# Patient Record
Sex: Female | Born: 1960 | Race: White | Hispanic: No | Marital: Married
Health system: Southern US, Community
[De-identification: ages and names within clinical notes are randomized; demographics above are authoritative.]

---

## 2001-11-22 ENCOUNTER — Encounter: Payer: Self-pay | Admitting: Family Medicine

## 2001-11-22 ENCOUNTER — Encounter: Admission: RE | Admit: 2001-11-22 | Discharge: 2001-11-22 | Payer: Self-pay | Admitting: Family Medicine

## 2004-09-27 ENCOUNTER — Other Ambulatory Visit: Admission: RE | Admit: 2004-09-27 | Discharge: 2004-09-27 | Payer: Self-pay | Admitting: Family Medicine

## 2006-09-11 ENCOUNTER — Other Ambulatory Visit: Admission: RE | Admit: 2006-09-11 | Discharge: 2006-09-11 | Payer: Self-pay | Admitting: Family Medicine

## 2014-08-18 ENCOUNTER — Other Ambulatory Visit: Payer: Self-pay

## 2014-08-18 DIAGNOSIS — Z1231 Encounter for screening mammogram for malignant neoplasm of breast: Secondary | ICD-10-CM

## 2014-08-25 ENCOUNTER — Ambulatory Visit
Admission: RE | Admit: 2014-08-25 | Discharge: 2014-08-25 | Disposition: A | Discharge status: Home / Self Care | Payer: BLUE CROSS/BLUE SHIELD | Source: Ambulatory Visit

## 2014-08-25 DIAGNOSIS — Z1231 Encounter for screening mammogram for malignant neoplasm of breast: Secondary | ICD-10-CM

## 2014-09-02 ENCOUNTER — Other Ambulatory Visit: Payer: Self-pay | Admitting: Family Medicine

## 2014-09-02 DIAGNOSIS — R928 Other abnormal and inconclusive findings on diagnostic imaging of breast: Secondary | ICD-10-CM

## 2014-09-05 ENCOUNTER — Ambulatory Visit
Admission: RE | Admit: 2014-09-05 | Discharge: 2014-09-05 | Disposition: A | Discharge status: Home / Self Care | Payer: BLUE CROSS/BLUE SHIELD | Source: Ambulatory Visit | Attending: Family Medicine | Admitting: Family Medicine

## 2014-09-05 DIAGNOSIS — R928 Other abnormal and inconclusive findings on diagnostic imaging of breast: Secondary | ICD-10-CM

## 2015-03-04 ENCOUNTER — Other Ambulatory Visit: Payer: Self-pay | Admitting: Family Medicine

## 2015-03-04 DIAGNOSIS — N6310 Unspecified lump in the right breast, unspecified quadrant: Secondary | ICD-10-CM

## 2015-03-13 ENCOUNTER — Ambulatory Visit
Admission: RE | Admit: 2015-03-13 | Discharge: 2015-03-13 | Disposition: A | Discharge status: Home / Self Care | Payer: BLUE CROSS/BLUE SHIELD | Source: Ambulatory Visit | Attending: Family Medicine | Admitting: Family Medicine

## 2015-03-13 DIAGNOSIS — N6310 Unspecified lump in the right breast, unspecified quadrant: Secondary | ICD-10-CM

## 2016-01-25 ENCOUNTER — Other Ambulatory Visit: Payer: Self-pay | Admitting: Family Medicine

## 2016-01-25 DIAGNOSIS — N63 Unspecified lump in unspecified breast: Secondary | ICD-10-CM

## 2016-02-02 ENCOUNTER — Ambulatory Visit
Admission: RE | Admit: 2016-02-02 | Discharge: 2016-02-02 | Disposition: A | Discharge status: Home / Self Care | Payer: BLUE CROSS/BLUE SHIELD | Source: Ambulatory Visit | Attending: Family Medicine | Admitting: Family Medicine

## 2016-02-02 DIAGNOSIS — N63 Unspecified lump in unspecified breast: Secondary | ICD-10-CM

## 2017-02-13 ENCOUNTER — Other Ambulatory Visit: Payer: Self-pay | Admitting: Family Medicine

## 2017-02-13 DIAGNOSIS — Z1231 Encounter for screening mammogram for malignant neoplasm of breast: Secondary | ICD-10-CM

## 2017-03-13 ENCOUNTER — Ambulatory Visit: Payer: BLUE CROSS/BLUE SHIELD

## 2017-03-20 ENCOUNTER — Ambulatory Visit
Admission: RE | Admit: 2017-03-20 | Discharge: 2017-03-20 | Disposition: A | Discharge status: Home / Self Care | Payer: BLUE CROSS/BLUE SHIELD | Source: Ambulatory Visit | Attending: Family Medicine | Admitting: Family Medicine

## 2017-03-20 DIAGNOSIS — Z1231 Encounter for screening mammogram for malignant neoplasm of breast: Secondary | ICD-10-CM

## 2017-05-05 ENCOUNTER — Other Ambulatory Visit: Payer: Self-pay | Admitting: Family Medicine

## 2017-05-05 DIAGNOSIS — Z139 Encounter for screening, unspecified: Secondary | ICD-10-CM

## 2017-06-21 DIAGNOSIS — K5730 Diverticulosis of large intestine without perforation or abscess without bleeding: Secondary | ICD-10-CM | POA: Diagnosis not present

## 2017-06-21 DIAGNOSIS — Z1211 Encounter for screening for malignant neoplasm of colon: Secondary | ICD-10-CM | POA: Diagnosis not present

## 2017-06-21 DIAGNOSIS — K635 Polyp of colon: Secondary | ICD-10-CM | POA: Diagnosis not present

## 2017-06-21 DIAGNOSIS — K648 Other hemorrhoids: Secondary | ICD-10-CM | POA: Diagnosis not present

## 2017-06-27 DIAGNOSIS — K635 Polyp of colon: Secondary | ICD-10-CM | POA: Diagnosis not present

## 2017-06-27 DIAGNOSIS — Z1211 Encounter for screening for malignant neoplasm of colon: Secondary | ICD-10-CM | POA: Diagnosis not present

## 2018-02-14 DIAGNOSIS — Z131 Encounter for screening for diabetes mellitus: Secondary | ICD-10-CM | POA: Diagnosis not present

## 2018-02-14 DIAGNOSIS — Z0000 Encounter for general adult medical examination without abnormal findings: Secondary | ICD-10-CM | POA: Diagnosis not present

## 2018-02-14 DIAGNOSIS — Z13220 Encounter for screening for lipoid disorders: Secondary | ICD-10-CM | POA: Diagnosis not present

## 2018-03-21 ENCOUNTER — Ambulatory Visit
Admission: RE | Admit: 2018-03-21 | Discharge: 2018-03-21 | Disposition: A | Discharge status: Home / Self Care | Payer: BLUE CROSS/BLUE SHIELD | Source: Ambulatory Visit | Attending: Family Medicine | Admitting: Family Medicine

## 2018-03-21 DIAGNOSIS — Z1231 Encounter for screening mammogram for malignant neoplasm of breast: Secondary | ICD-10-CM | POA: Diagnosis not present

## 2018-03-21 DIAGNOSIS — Z139 Encounter for screening, unspecified: Secondary | ICD-10-CM

## 2019-02-19 DIAGNOSIS — Z0000 Encounter for general adult medical examination without abnormal findings: Secondary | ICD-10-CM | POA: Diagnosis not present

## 2019-02-19 DIAGNOSIS — L309 Dermatitis, unspecified: Secondary | ICD-10-CM | POA: Diagnosis not present

## 2019-03-04 ENCOUNTER — Other Ambulatory Visit: Payer: Self-pay | Admitting: Family Medicine

## 2019-03-04 DIAGNOSIS — Z1231 Encounter for screening mammogram for malignant neoplasm of breast: Secondary | ICD-10-CM

## 2019-03-08 DIAGNOSIS — Z13220 Encounter for screening for lipoid disorders: Secondary | ICD-10-CM | POA: Diagnosis not present

## 2019-03-08 DIAGNOSIS — Z131 Encounter for screening for diabetes mellitus: Secondary | ICD-10-CM | POA: Diagnosis not present

## 2019-03-22 DIAGNOSIS — H60503 Unspecified acute noninfective otitis externa, bilateral: Secondary | ICD-10-CM | POA: Diagnosis not present

## 2019-04-17 ENCOUNTER — Ambulatory Visit: Payer: BC Managed Care – PPO | Attending: Internal Medicine

## 2019-04-17 DIAGNOSIS — Z23 Encounter for immunization: Secondary | ICD-10-CM | POA: Diagnosis not present

## 2019-04-17 NOTE — Progress Notes (Signed)
   Covid-19 Vaccination Clinic  Name:  Abigail Wall    MRN: [id number] DOB: [birth year]  04/17/2019  [name] was observed post Covid-19 immunization for 15 minutes without incidence. She was provided with Vaccine Information Sheet and instruction to access the V-Safe system.   [name] was instructed to call 911 with any severe reactions post vaccine: Marland Kitchen Difficulty breathing  . Swelling of your face and throat  . A fast heartbeat  . A bad rash all over your body  . Dizziness and weakness    Immunizations Administered    Name Date Dose VIS Date Route   Pfizer COVID-19 Vaccine 04/17/2019  9:22 AM 0.3 mL 03/15/2019 Intramuscular   Manufacturer: [employer], [employer]   Lot: [id number]   NDC: [address]-9

## 2019-04-18 ENCOUNTER — Ambulatory Visit
Admission: RE | Admit: 2019-04-18 | Discharge: 2019-04-18 | Disposition: A | Discharge status: Home / Self Care | Payer: BC Managed Care – PPO | Source: Ambulatory Visit | Attending: Family Medicine | Admitting: Family Medicine

## 2019-04-18 ENCOUNTER — Other Ambulatory Visit: Payer: Self-pay

## 2019-04-18 DIAGNOSIS — Z1231 Encounter for screening mammogram for malignant neoplasm of breast: Secondary | ICD-10-CM | POA: Diagnosis not present

## 2019-05-08 ENCOUNTER — Ambulatory Visit: Payer: BC Managed Care – PPO | Attending: Internal Medicine

## 2019-05-08 DIAGNOSIS — Z23 Encounter for immunization: Secondary | ICD-10-CM

## 2019-05-08 NOTE — Progress Notes (Signed)
   Covid-19 Vaccination Clinic  Name:  SHERRELL WEIR    MRN: [id number] DOB: 11-21-1960  05/08/2019  Ms. Beale was observed post Covid-19 immunization for 15 minutes without incidence. She was provided with Vaccine Information Sheet and instruction to access the V-Safe system.   Ms. Schlereth was instructed to call 911 with any severe reactions post vaccine: Marland Kitchen Difficulty breathing  . Swelling of your face and throat  . A fast heartbeat  . A bad rash all over your body  . Dizziness and weakness    Immunizations Administered    Name Date Dose VIS Date Route   Pfizer COVID-19 Vaccine 05/08/2019  8:09 AM 0.3 mL 03/15/2019 Intramuscular   Manufacturer: [employer], [employer]   Lot: [id number]   NDC: [address]-9

## 2020-01-09 ENCOUNTER — Other Ambulatory Visit: Payer: Self-pay | Admitting: Family Medicine

## 2020-01-09 DIAGNOSIS — Z1231 Encounter for screening mammogram for malignant neoplasm of breast: Secondary | ICD-10-CM

## 2020-02-17 DIAGNOSIS — R21 Rash and other nonspecific skin eruption: Secondary | ICD-10-CM | POA: Diagnosis not present

## 2020-02-17 DIAGNOSIS — Z20822 Contact with and (suspected) exposure to covid-19: Secondary | ICD-10-CM | POA: Diagnosis not present

## 2020-02-17 DIAGNOSIS — Z13220 Encounter for screening for lipoid disorders: Secondary | ICD-10-CM | POA: Diagnosis not present

## 2020-02-17 DIAGNOSIS — Z0000 Encounter for general adult medical examination without abnormal findings: Secondary | ICD-10-CM | POA: Diagnosis not present

## 2020-04-21 ENCOUNTER — Ambulatory Visit: Payer: BC Managed Care – PPO

## 2020-06-04 IMAGING — MG DIGITAL SCREENING BILAT W/ CAD
4 series · 4 of 4 positions shown · non-contrast
Comparison: Previous exam(s).

CLINICAL DATA: Screening.

EXAM:
DIGITAL SCREENING BILATERAL MAMMOGRAM WITH CAD

[L MLO]
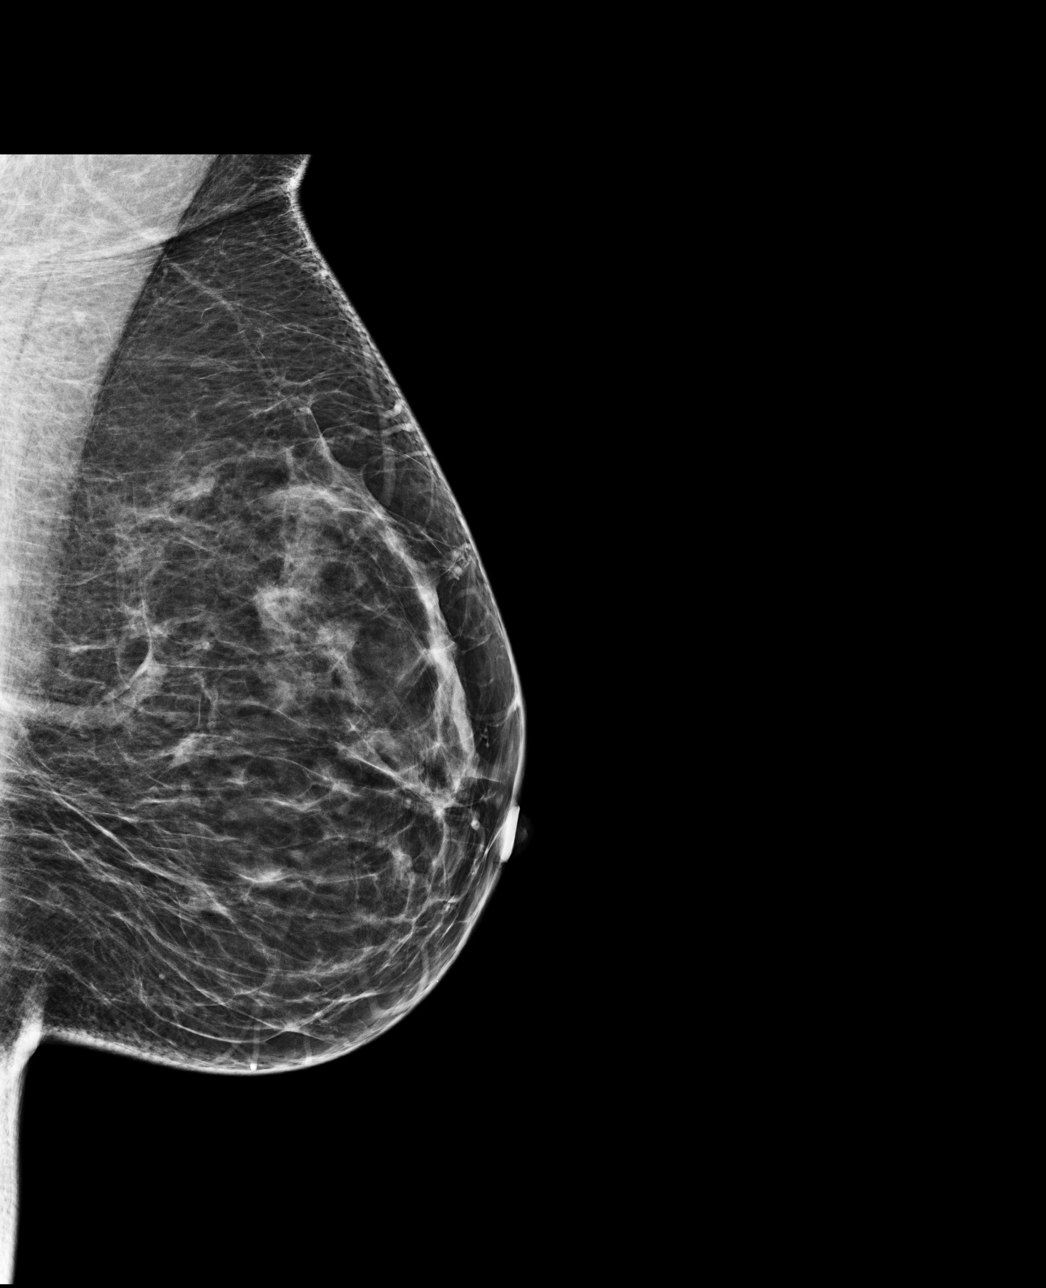

[R CC]
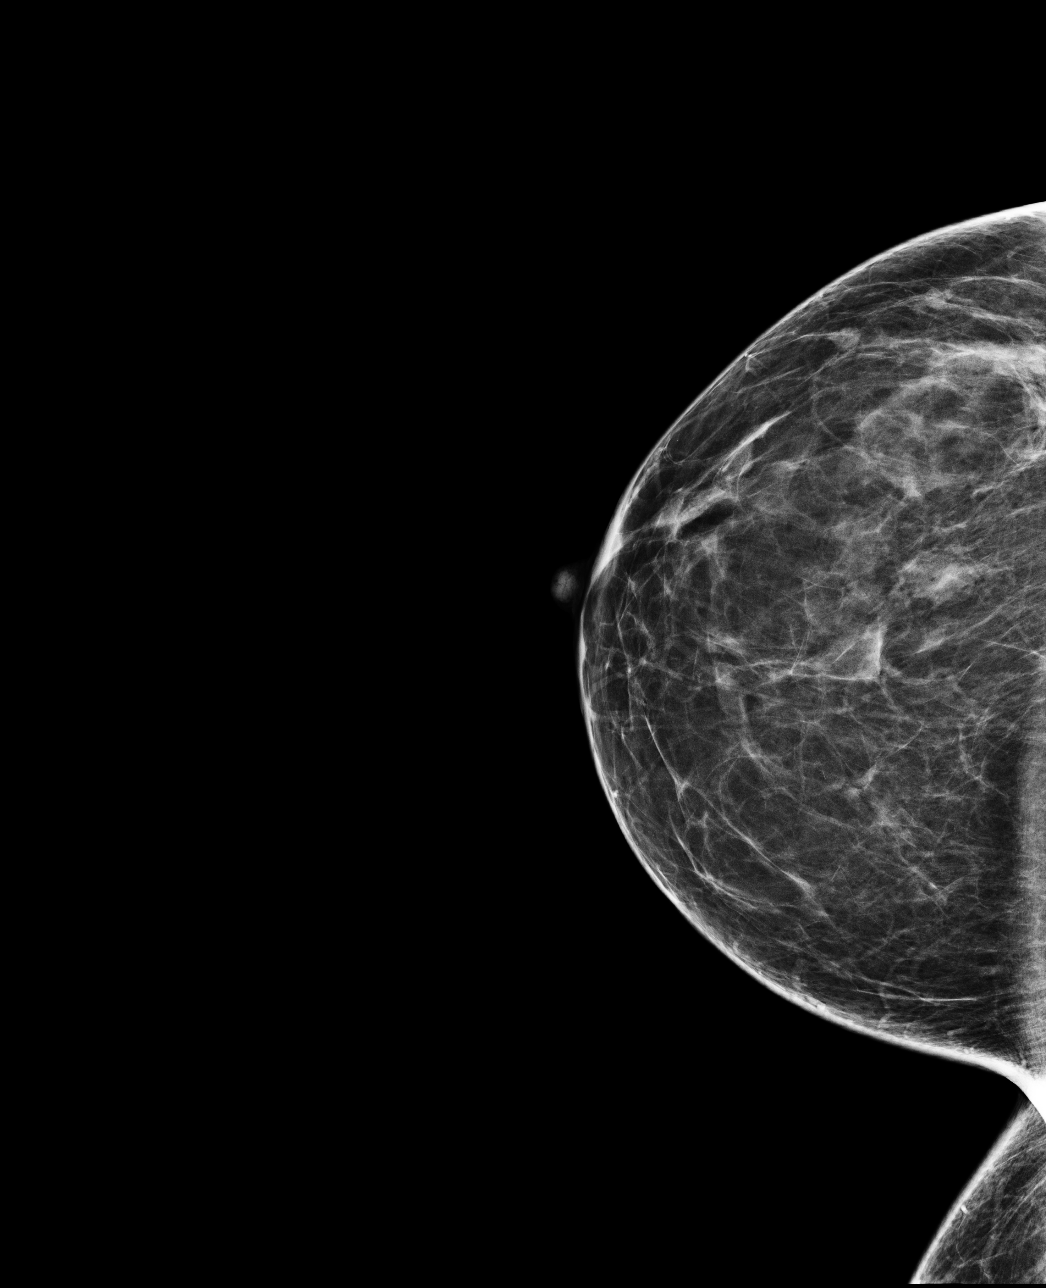

[L CC]
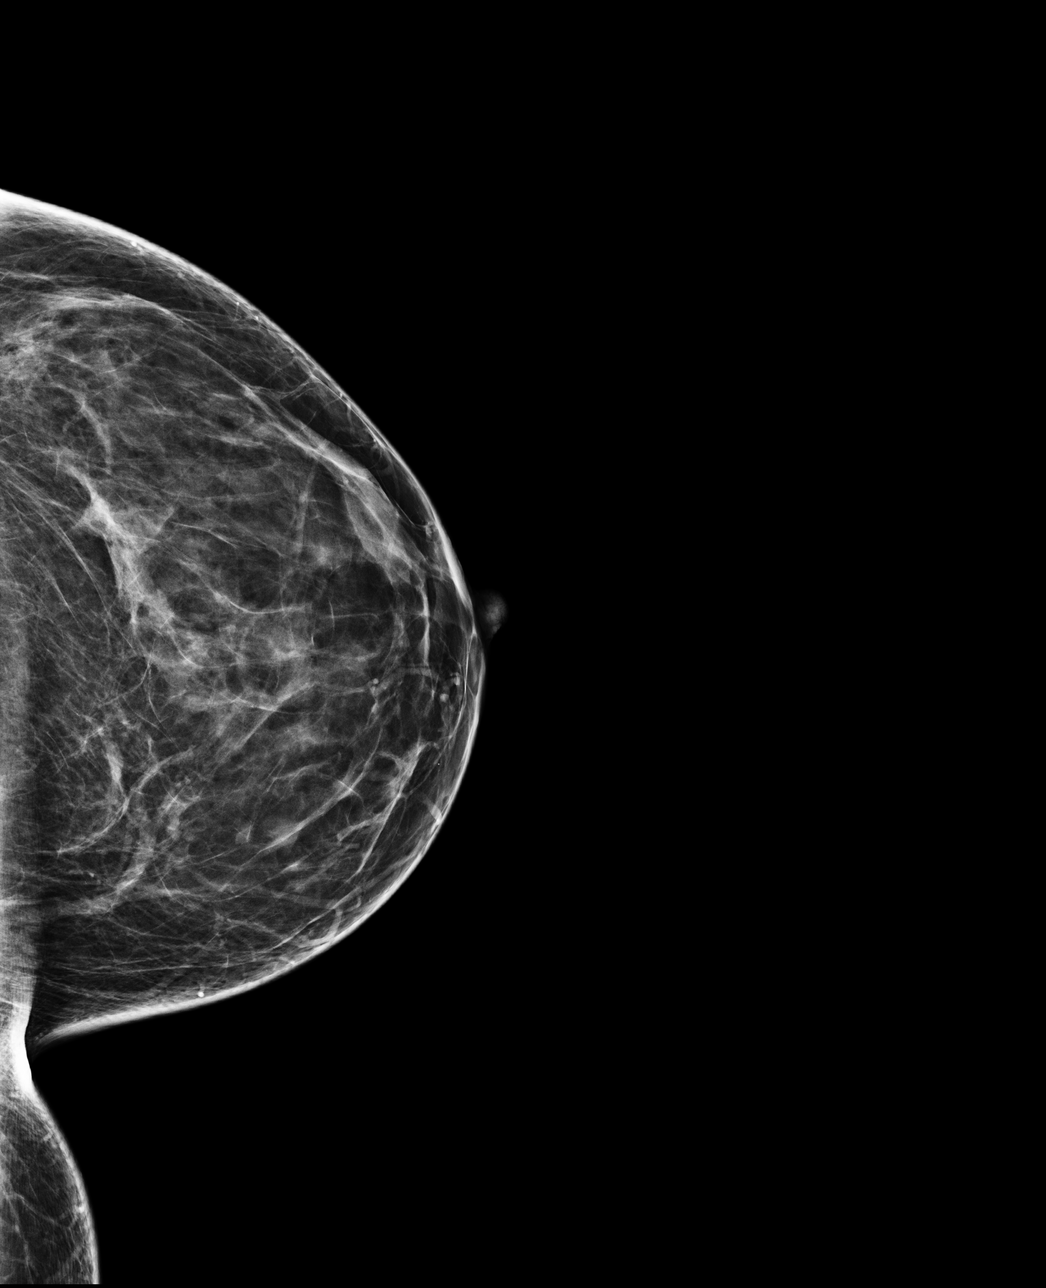

[R MLO]
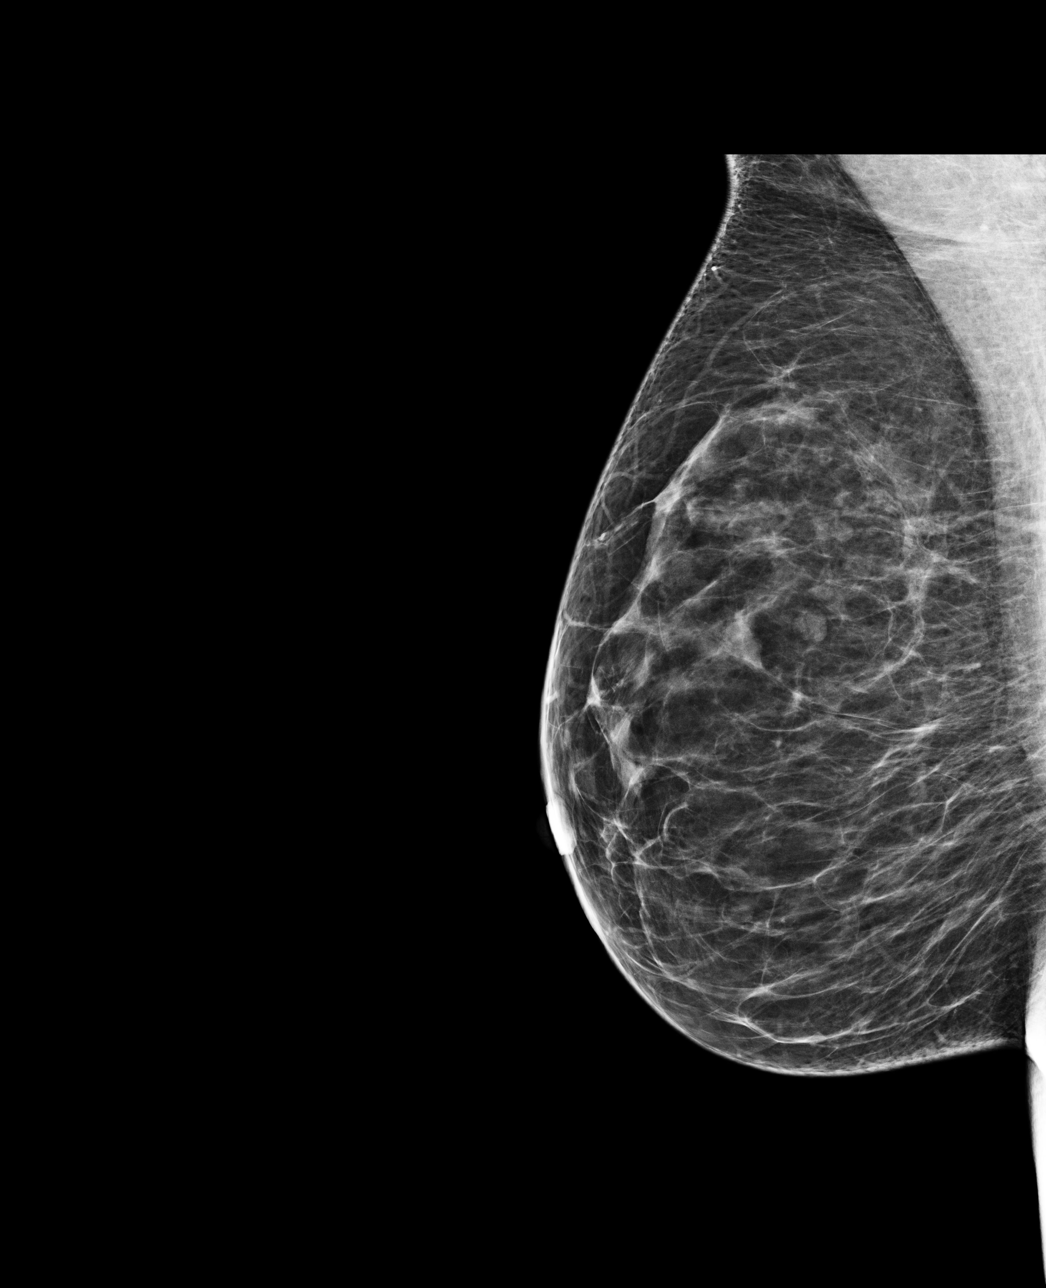

[4 of 4 positions shown; findings below may reference images not displayed]

ACR Breast Density Category c: The breast tissue is heterogeneously
dense, which may obscure small masses.
FINDINGS: There are no findings suspicious for malignancy. Images were
processed with CAD.
IMPRESSION: No mammographic evidence of malignancy. A result letter of this
screening mammogram will be mailed directly to the patient.

RECOMMENDATION:
Screening mammogram in one year. (Code:[id number])

BI-RADS CATEGORY  1: Negative.

## 2020-06-10 ENCOUNTER — Other Ambulatory Visit: Payer: Self-pay

## 2020-06-10 ENCOUNTER — Ambulatory Visit
Admission: RE | Admit: 2020-06-10 | Discharge: 2020-06-10 | Disposition: A | Discharge status: Home / Self Care | Payer: BC Managed Care – PPO | Source: Ambulatory Visit | Attending: Family Medicine | Admitting: Family Medicine

## 2020-06-10 DIAGNOSIS — Z1231 Encounter for screening mammogram for malignant neoplasm of breast: Secondary | ICD-10-CM

## 2020-06-15 ENCOUNTER — Other Ambulatory Visit: Payer: Self-pay | Admitting: Family Medicine

## 2020-06-15 DIAGNOSIS — R928 Other abnormal and inconclusive findings on diagnostic imaging of breast: Secondary | ICD-10-CM

## 2020-06-23 ENCOUNTER — Other Ambulatory Visit: Payer: Self-pay | Admitting: Family Medicine

## 2020-06-23 DIAGNOSIS — R928 Other abnormal and inconclusive findings on diagnostic imaging of breast: Secondary | ICD-10-CM

## 2020-06-26 ENCOUNTER — Ambulatory Visit
Admission: RE | Admit: 2020-06-26 | Discharge: 2020-06-26 | Disposition: A | Discharge status: Home / Self Care | Payer: BC Managed Care – PPO | Source: Ambulatory Visit | Attending: Family Medicine | Admitting: Family Medicine

## 2020-06-26 ENCOUNTER — Other Ambulatory Visit: Payer: Self-pay

## 2020-06-26 DIAGNOSIS — R928 Other abnormal and inconclusive findings on diagnostic imaging of breast: Secondary | ICD-10-CM

## 2021-05-17 ENCOUNTER — Other Ambulatory Visit: Payer: Self-pay | Admitting: Family Medicine

## 2021-05-17 DIAGNOSIS — Z1231 Encounter for screening mammogram for malignant neoplasm of breast: Secondary | ICD-10-CM

## 2021-06-28 DIAGNOSIS — Z1231 Encounter for screening mammogram for malignant neoplasm of breast: Secondary | ICD-10-CM

## 2021-12-03 ENCOUNTER — Ambulatory Visit
Admission: RE | Admit: 2021-12-03 | Discharge: 2021-12-03 | Disposition: A | Discharge status: Home / Self Care | Payer: No Typology Code available for payment source | Source: Ambulatory Visit | Attending: Family Medicine | Admitting: Family Medicine

## 2021-12-03 DIAGNOSIS — Z1231 Encounter for screening mammogram for malignant neoplasm of breast: Secondary | ICD-10-CM

## 2022-02-01 ENCOUNTER — Other Ambulatory Visit (HOSPITAL_BASED_OUTPATIENT_CLINIC_OR_DEPARTMENT_OTHER): Payer: Self-pay

## 2022-02-01 MED ORDER — INFLUENZA VAC SPLIT QUAD 0.5 ML IM SUSY
PREFILLED_SYRINGE | INTRAMUSCULAR | 0 refills | Status: AC
  Filled 2022-02-01: qty 0.5, 1d supply, fill #0

## 2022-09-05 ENCOUNTER — Other Ambulatory Visit: Payer: Self-pay | Admitting: Family Medicine

## 2022-09-05 DIAGNOSIS — Z1231 Encounter for screening mammogram for malignant neoplasm of breast: Secondary | ICD-10-CM

## 2022-12-09 ENCOUNTER — Ambulatory Visit
Admission: RE | Admit: 2022-12-09 | Discharge: 2022-12-09 | Disposition: A | Discharge status: Home / Self Care | Payer: 59 | Source: Ambulatory Visit | Attending: Family Medicine | Admitting: Family Medicine

## 2022-12-09 DIAGNOSIS — Z1231 Encounter for screening mammogram for malignant neoplasm of breast: Secondary | ICD-10-CM

## 2023-04-06 ENCOUNTER — Other Ambulatory Visit (HOSPITAL_BASED_OUTPATIENT_CLINIC_OR_DEPARTMENT_OTHER): Payer: Self-pay

## 2023-04-06 MED ORDER — BOOSTRIX 5-2.5-18.5 LF-MCG/0.5 IM SUSY
0.5000 mL | PREFILLED_SYRINGE | Freq: Once | INTRAMUSCULAR | 0 refills | Status: AC
Start: 2023-04-06 — End: 2023-04-07
  Filled 2023-04-06: qty 0.5, 1d supply, fill #0

## 2023-05-05 ENCOUNTER — Other Ambulatory Visit (HOSPITAL_BASED_OUTPATIENT_CLINIC_OR_DEPARTMENT_OTHER): Payer: Self-pay

## 2023-05-05 MED ORDER — COMIRNATY 30 MCG/0.3ML IM SUSY
0.3000 mL | PREFILLED_SYRINGE | Freq: Once | INTRAMUSCULAR | 0 refills | Status: AC
  Filled 2023-05-05: qty 0.3, 1d supply, fill #0

## 2023-05-26 ENCOUNTER — Other Ambulatory Visit (HOSPITAL_BASED_OUTPATIENT_CLINIC_OR_DEPARTMENT_OTHER): Payer: Self-pay

## 2023-05-26 MED ORDER — SHINGRIX 50 MCG/0.5ML IM SUSR
0.5000 mL | Freq: Once | INTRAMUSCULAR | 0 refills | Status: AC
Start: 2023-05-26 — End: 2023-05-27
  Filled 2023-05-26: qty 0.5, 1d supply, fill #0

## 2023-11-01 ENCOUNTER — Other Ambulatory Visit: Payer: Self-pay | Admitting: Family Medicine

## 2023-11-01 DIAGNOSIS — Z1231 Encounter for screening mammogram for malignant neoplasm of breast: Secondary | ICD-10-CM

## 2023-11-27 ENCOUNTER — Other Ambulatory Visit: Admitting: *Deleted

## 2023-11-27 ENCOUNTER — Ambulatory Visit

## 2023-11-27 DIAGNOSIS — Z1211 Encounter for screening for malignant neoplasm of colon: Secondary | ICD-10-CM

## 2023-11-27 NOTE — Progress Notes (Signed)
 See Colorectal Cancer flowsheet for health history.   FIT test given to patient to complete.

## 2023-12-11 ENCOUNTER — Ambulatory Visit
Admission: RE | Admit: 2023-12-11 | Discharge: 2023-12-11 | Disposition: A | Discharge status: Home / Self Care | Source: Ambulatory Visit | Attending: Family Medicine | Admitting: Family Medicine

## 2023-12-11 DIAGNOSIS — Z1231 Encounter for screening mammogram for malignant neoplasm of breast: Secondary | ICD-10-CM
